# Patient Record
Sex: Male | Born: 1965 | Race: White | Hispanic: No | Marital: Single | Smoking: Current every day smoker
Health system: Southern US, Community
[De-identification: ages and names within clinical notes are randomized; demographics above are authoritative.]

## PROBLEM LIST (undated history)

## (undated) HISTORY — PX: HIP SURGERY: SHX245

## (undated) HISTORY — PX: BLADDER SURGERY: SHX569

---

## 2005-11-19 ENCOUNTER — Emergency Department: Payer: Self-pay | Admitting: Emergency Medicine

## 2006-02-05 ENCOUNTER — Emergency Department: Payer: Self-pay | Admitting: Emergency Medicine

## 2006-05-19 ENCOUNTER — Emergency Department: Payer: Self-pay | Admitting: Emergency Medicine

## 2007-08-20 ENCOUNTER — Emergency Department: Payer: Self-pay | Admitting: Emergency Medicine

## 2011-02-19 ENCOUNTER — Emergency Department: Payer: Self-pay | Admitting: Emergency Medicine

## 2011-06-10 ENCOUNTER — Emergency Department: Payer: Self-pay | Admitting: Emergency Medicine

## 2015-10-10 ENCOUNTER — Emergency Department
Admission: EM | Admit: 2015-10-10 | Discharge: 2015-10-10 | Disposition: A | Discharge status: Home / Self Care | Payer: Self-pay | Attending: Student | Admitting: Student

## 2015-10-10 ENCOUNTER — Encounter: Payer: Self-pay | Admitting: Emergency Medicine

## 2015-10-10 ENCOUNTER — Emergency Department: Payer: Self-pay

## 2015-10-10 DIAGNOSIS — Y9289 Other specified places as the place of occurrence of the external cause: Secondary | ICD-10-CM | POA: Insufficient documentation

## 2015-10-10 DIAGNOSIS — Y93E5 Activity, floor mopping and cleaning: Secondary | ICD-10-CM | POA: Insufficient documentation

## 2015-10-10 DIAGNOSIS — S93491A Sprain of other ligament of right ankle, initial encounter: Secondary | ICD-10-CM | POA: Insufficient documentation

## 2015-10-10 DIAGNOSIS — F17200 Nicotine dependence, unspecified, uncomplicated: Secondary | ICD-10-CM | POA: Insufficient documentation

## 2015-10-10 DIAGNOSIS — W1842XA Slipping, tripping and stumbling without falling due to stepping into hole or opening, initial encounter: Secondary | ICD-10-CM | POA: Insufficient documentation

## 2015-10-10 DIAGNOSIS — Y998 Other external cause status: Secondary | ICD-10-CM | POA: Insufficient documentation

## 2015-10-10 MED ORDER — MELOXICAM 15 MG PO TABS: 15.0000 mg | ORAL_TABLET | Freq: Every day | ORAL | Status: AC

## 2015-10-10 NOTE — ED Notes (Signed)
Pt was helping brother clean up trash and stepped in hole and rolled right ankle. Worst pain across top ankle.

## 2015-10-10 NOTE — Discharge Instructions (Signed)
Ankle Sprain °An ankle sprain is an injury to the strong, fibrous tissues (ligaments) that hold the bones of your ankle joint together.  °CAUSES °An ankle sprain is usually caused by a fall or by twisting your ankle. Ankle sprains most commonly occur when you step on the outer edge of your foot, and your ankle turns inward. People who participate in sports are more prone to these types of injuries.  °SYMPTOMS  °· Pain in your ankle. The pain may be present at rest or only when you are trying to stand or walk. °· Swelling. °· Bruising. Bruising may develop immediately or within 1 to 2 days after your injury. °· Difficulty standing or walking, particularly when turning corners or changing directions. °DIAGNOSIS  °Your caregiver will ask you details about your injury and perform a physical exam of your ankle to determine if you have an ankle sprain. During the physical exam, your caregiver will press on and apply pressure to specific areas of your foot and ankle. Your caregiver will try to move your ankle in certain ways. An X-ray exam may be done to be sure a bone was not broken or a ligament did not separate from one of the bones in your ankle (avulsion fracture).  °TREATMENT  °Certain types of braces can help stabilize your ankle. Your caregiver can make a recommendation for this. Your caregiver may recommend the use of medicine for pain. If your sprain is severe, your caregiver may refer you to a surgeon who helps to restore function to parts of your skeletal system (orthopedist) or a physical therapist. °HOME CARE INSTRUCTIONS  °· Apply ice to your injury for 1-2 days or as directed by your caregiver. Applying ice helps to reduce inflammation and pain. °· Put ice in a plastic bag. °· Place a towel between your skin and the bag. °· Leave the ice on for 15-20 minutes at a time, every 2 hours while you are awake. °· Only take over-the-counter or prescription medicines for pain, discomfort, or fever as directed by  your caregiver. °· Elevate your injured ankle above the level of your heart as much as possible for 2-3 days. °· If your caregiver recommends crutches, use them as instructed. Gradually put weight on the affected ankle. Continue to use crutches or a cane until you can walk without feeling pain in your ankle. °· If you have a plaster splint, wear the splint as directed by your caregiver. Do not rest it on anything harder than a pillow for the first 24 hours. Do not put weight on it. Do not get it wet. You may take it off to take a shower or bath. °· You may have been given an elastic bandage to wear around your ankle to provide support. If the elastic bandage is too tight (you have numbness or tingling in your foot or your foot becomes cold and blue), adjust the bandage to make it comfortable. °· If you have an air splint, you may blow more air into it or let air out to make it more comfortable. You may take your splint off at night and before taking a shower or bath. Wiggle your toes in the splint several times per day to decrease swelling. °SEEK MEDICAL CARE IF:  °· You have rapidly increasing bruising or swelling. °· Your toes feel extremely cold or you lose feeling in your foot. °· Your pain is not relieved with medicine. °SEEK IMMEDIATE MEDICAL CARE IF: °· Your toes are numb or blue. °·   You have severe pain that is increasing. °MAKE SURE YOU:  °· Understand these instructions. °· Will watch your condition. °· Will get help right away if you are not doing well or get worse. °  °This information is not intended to replace advice given to you by your health care provider. Make sure you discuss any questions you have with your health care provider. °  °Document Released: 09/01/2005 Document Revised: 09/22/2014 Document Reviewed: 09/13/2011 °Elsevier Interactive Patient Education ©2016 [employer] ° °Elastic Bandage and RICE °WHAT DOES AN ELASTIC BANDAGE DO? °Elastic bandages come in different shapes and sizes.  They generally provide support to your injury and reduce swelling while you are healing, but they can perform different functions. Your health care provider will help you to decide what is best for your protection, recovery, or rehabilitation following an injury. °WHAT ARE SOME GENERAL TIPS FOR USING AN ELASTIC BANDAGE? °· Use the bandage as directed by the maker of the bandage that you are using. °· Do not wrap the bandage too tightly. This may cut off the circulation in the arm or leg in the area below the bandage. °· If part of your body beyond the bandage becomes blue, numb, cold, swollen, or is more painful, your bandage is most likely too tight. If this occurs, remove your bandage and reapply it more loosely. °· See your health care provider if the bandage seems to be making your problems worse rather than better. °· An elastic bandage should be removed and reapplied every 3-4 hours or as directed by your health care provider. °WHAT IS RICE? °The routine care of many injuries includes rest, ice, compression, and elevation (RICE therapy).  °Rest °Rest is required to allow your body to heal. Generally, you can resume your routine activities when you are comfortable and have been given permission by your health care provider. °Ice °Icing your injury helps to keep the swelling down and it reduces pain. Do not apply ice directly to your skin. °· Put ice in a plastic bag. °· Place a towel between your skin and the bag. °· Leave the ice on for 20 minutes, 2-3 times per day. °Do this for as long as you are directed by your health care provider. °Compression °Compression helps to keep swelling down, gives support, and helps with discomfort. Compression may be done with an elastic bandage. °Elevation °Elevation helps to reduce swelling and it decreases pain. If possible, your injured area should be placed at or above the level of your heart or the center of your chest. °WHEN SHOULD I SEEK MEDICAL CARE? °You should seek  medical care if: °· You have persistent pain and swelling. °· Your symptoms are getting worse rather than improving. °These symptoms may indicate that further evaluation or further X-rays are needed. Sometimes, X-rays may not show a small broken bone (fracture) until a number of days later. Make a follow-up appointment with your health care provider. Ask when your X-ray results will be ready. Make sure that you get your X-ray results. °WHEN SHOULD I SEEK IMMEDIATE MEDICAL CARE? °You should seek immediate medical care if: °· You have a sudden onset of severe pain at or below the area of your injury. °· You develop redness or increased swelling around your injury. °· You have tingling or numbness at or below the area of your injury that does not improve after you remove the elastic bandage. °  °This information is not intended to replace advice given to you by your   health care provider. Make sure you discuss any questions you have with your health care provider. °  °Document Released: 02/21/2002 Document Revised: 05/23/2015 Document Reviewed: 04/17/2014 °Elsevier Interactive Patient Education ©2016 [employer] ° °Stirrup Ankle Brace °Stirrup ankle braces give support and help stabilize the ankle joint. They are rigid pieces of plastic or fiberglass that go up both sides of the lower leg with the bottom of the stirrup fitting comfortably under the bottom of the instep of the foot. It can be held on with Velcro straps or an elastic wrap. Stirrup ankle braces are used to support the ankle following mild or moderate sprains or strains, or fractures after cast removal.  °They can be easily removed or adjusted if there is swelling. The rigid brace shells are designed to fit the ankle comfortably and provide the needed medial/lateral stabilization. This brace can be easily worn with most athletic shoes. The brace liner is usually made of a soft, comfortable gel-like material. This gel fits the ankle well without causing  uncomfortable pressure points.  °IMPORTANCE OF ANKLE BRACES: °· The use of ankle bracing is effective in the prevention of ankle sprains. °· In athletes, the use of ankle bracing will offer protection and prevent further sprains. °· Research shows that a complete rehabilitation program needs to be included with external bracing. This includes range of motion and ankle strengthening exercises. Your caregivers will instruct you in this. °If you were given the brace today for a new injury, use the following home care instructions as a guide. °HOME CARE INSTRUCTIONS  °· Apply ice to the sore area for 15-20 minutes, 03-04 times per day while awake for the first 2 days. Put the ice in a plastic bag and place a towel between the bag of ice and your skin. Never place the ice pack directly on your skin. Be especially careful using ice on an elbow or knee or other bony area, such as your ankle, because icing for too long may damage the nerves which are close to the surface. °· Keep your leg elevated when possible to lessen swelling. °· Wear your splint until you are seen for a follow-up examination. Do not put weight on it. Do not get it wet. You may take it off to take a shower or bath. °· For Activity: Use crutches with non-weight bearing for 1 week. Then, you may walk on your ankle as instructed. Start gradually with weight bearing on the affected ankle. °· Continue to use crutches or a cane until you can stand on your ankle without causing pain. °· Wiggle your toes in the splint several times per day if you are able. °· The splint is too tight if you have numbness, tingling, or if your foot becomes cold and blue. Adjust the straps or elastic bandage to make it comfortable. °· Only take over-the-counter or prescription medicines for pain, discomfort, or fever as directed by your caregiver. °SEEK IMMEDIATE MEDICAL CARE IF:  °· You have increased bruising, swelling or pain. °· Your toes are blue or cold and loosening the  brace or wrap does not help. °· Your pain is not relieved with medicine. °MAKE SURE YOU:  °· Understand these instructions. °· Will watch your condition. °· Will get help right away if you are not doing well or get worse. °  °This information is not intended to replace advice given to you by your health care provider. Make sure you discuss any questions you have with your health care provider. °  °  Document Released: 07/02/2004 Document Revised: 11/24/2011 Document Reviewed: 04/03/2015 °Elsevier Interactive Patient Education ©2016 Elsevier Inc. ° °

## 2015-10-10 NOTE — ED Provider Notes (Signed)
[facility] Emergency Department Provider Note  ____________________________________________  Time seen: Approximately 10:02 PM  I have reviewed the triage vital signs and the nursing notes.   HISTORY  Chief Complaint Ankle Pain    HPI Micheal Wells is a 50 y.o. male who presents emergency department complaining of right ankle pain. Patient states that he was hoping somebody when he stepped into a hole, twisting his ankle. He is endorsing pain to the anterior portion of his ankle. He reports that he is unable to bear weight the ankle. He denies any numbness or tingling into his distal digits. He has not taken any medications prior to arrival   No past medical history on file.  There are no active problems to display for this patient.   Past Surgical History  Procedure Laterality Date  . Hip surgery    . Bladder surgery      Current Outpatient Rx  Name  Route  Sig  Dispense  Refill  . meloxicam (MOBIC) 15 MG tablet   Oral   Take 1 tablet (15 mg total) by mouth daily.   30 tablet   0     Allergies Review of patient's allergies indicates no known allergies.  No family history on file.  Social History Social History  Substance Use Topics  . Smoking status: Current Every Day Smoker  . Smokeless tobacco: Not on file  . Alcohol Use: No     Review of Systems  Constitutional: No fever/chills Eyes: No visual changes. No discharge ENT: No sore throat. Cardiovascular: no chest pain. Respiratory: no cough. No SOB. Gastrointestinal: No abdominal pain.  No nausea, no vomiting.  No diarrhea.  No constipation. Genitourinary: Negative for dysuria. No hematuria Musculoskeletal: Negative for back pain. Positive for right ankle pain Skin: Negative for rash. Neurological: Negative for headaches, focal weakness or numbness. 10-point ROS otherwise negative.  ____________________________________________   PHYSICAL EXAM:  VITAL SIGNS: ED  Triage Vitals  Enc Vitals Group     BP 10/10/15 2110 123/69 mmHg     Pulse Rate 10/10/15 2110 120     Resp 10/10/15 2110 20     Temp 10/10/15 2110 99.3 F (37.4 C)     Temp Source 10/10/15 2110 Oral     SpO2 10/10/15 2110 97 %     Weight 10/10/15 2110 190 lb (86.183 kg)     Height 10/10/15 2110  (1.702 m)     Head Cir --      Peak Flow --      Pain Score 10/10/15 2106 8     Pain Loc --      Pain Edu? --      Excl. in [place]? --      Constitutional: Alert and oriented. Well appearing and in no acute distress. Eyes: Conjunctivae are normal. PERRL. EOMI. Head: Atraumatic. ENT:      Ears:       Nose: No congestion/rhinnorhea.      Mouth/Throat: Mucous membranes are moist.  Neck: No stridor.   Hematological/Lymphatic/Immunilogical: No cervical lymphadenopathy. Cardiovascular: Normal rate, regular rhythm. Normal S1 and S2.  Good peripheral circulation. Respiratory: Normal respiratory effort without tachypnea or retractions. Lungs CTAB. Gastrointestinal: Soft and nontender. No distention. No CVA tenderness. Musculoskeletal: No lower extremity tenderness nor edema.  No joint effusions. Minor edema noted to the anterior aspect of the right ankle compared with left. Limited range of motion due to pain. Patient is tender to palpation of the talonavicular joint line  as well as the medial malleolus. There is no palpable abnormality. Her salicylate his pulses palpated. Sensation is intact 5 digits and is equal to the unaffected extremity. Neurologic:  Normal speech and language. No gross focal neurologic deficits are appreciated.  Skin:  Skin is warm, dry and intact. No rash noted. Psychiatric: Mood and affect are normal. Speech and behavior are normal. Patient exhibits appropriate insight and judgement.   ____________________________________________   LABS (all labs ordered are listed, but only abnormal results are displayed)  Labs Reviewed - No data to  display ____________________________________________  EKG   ____________________________________________  RADIOLOGY Festus Barren Cuthriell, personally viewed and evaluated these images (plain radiographs) as part of my medical decision making, as well as reviewing the written report by the radiologist.  Dg Ankle Complete Right  10/10/2015  CLINICAL DATA:  Rolled right ankle after stepping in hole, with medial and anterior right ankle pain. Initial encounter. EXAM: RIGHT ANKLE - COMPLETE 3+ VIEW COMPARISON:  None. FINDINGS: There is no evidence of fracture or dislocation. The ankle mortise is intact; the interosseous space is within normal limits. No talar tilt or subluxation is seen. A plantar calcaneal spur is noted. The joint spaces are preserved. No significant soft tissue abnormalities are seen. IMPRESSION: No evidence of fracture or dislocation. Electronically Signed   By: Roanna Raider M.D.   On: 10/10/2015 22:27    ____________________________________________    PROCEDURES  Procedure(s) performed:       Medications - No data to display   ____________________________________________   INITIAL IMPRESSION / ASSESSMENT AND PLAN / ED COURSE  Pertinent labs & imaging results that were available during my care of the patient were reviewed by me and considered in my medical decision making (see chart for details).  Patient's diagnosis is consistent with anterior talofibular ligament ankle sprain. Patient will be discharged home with prescriptions for anti-inflammatories. Patient is to follow up with orthopedics if symptoms persist past this treatment course. Patient is given ED precautions to return to the ED for any worsening or new symptoms.     ____________________________________________  FINAL CLINICAL IMPRESSION(S) / ED DIAGNOSES  Final diagnoses:  Sprain of anterior talofibular ligament, right, initial encounter      NEW MEDICATIONS STARTED DURING THIS  VISIT:  New Prescriptions   MELOXICAM (MOBIC) 15 MG TABLET    Take 1 tablet (15 mg total) by mouth daily.        Delorise Royals Cuthriell, PA-C 10/10/15 2956  Gayla Doss, MD 10/11/15 [phone]

## 2017-03-14 IMAGING — CR DG ANKLE COMPLETE 3+V*R*
1 series · 5 of 5 positions shown · non-contrast
Comparison: None.

CLINICAL DATA: Rolled right ankle after stepping in hole, with
medial and anterior right ankle pain. Initial encounter.

EXAM:
RIGHT ANKLE - COMPLETE 3+ VIEW

[Series 1: x ankle ap right · 0.14mm/px · 5 of 5 slices shown]
[im 1/5]
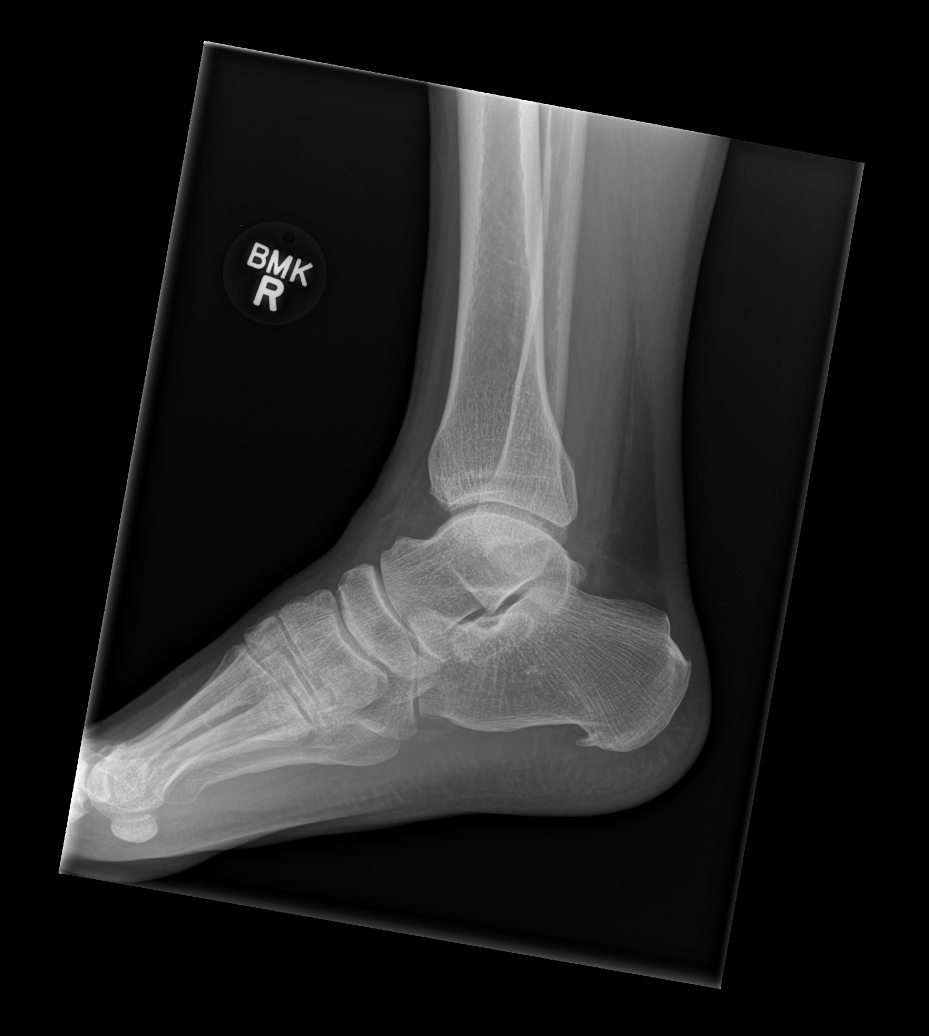
[im 2/5]
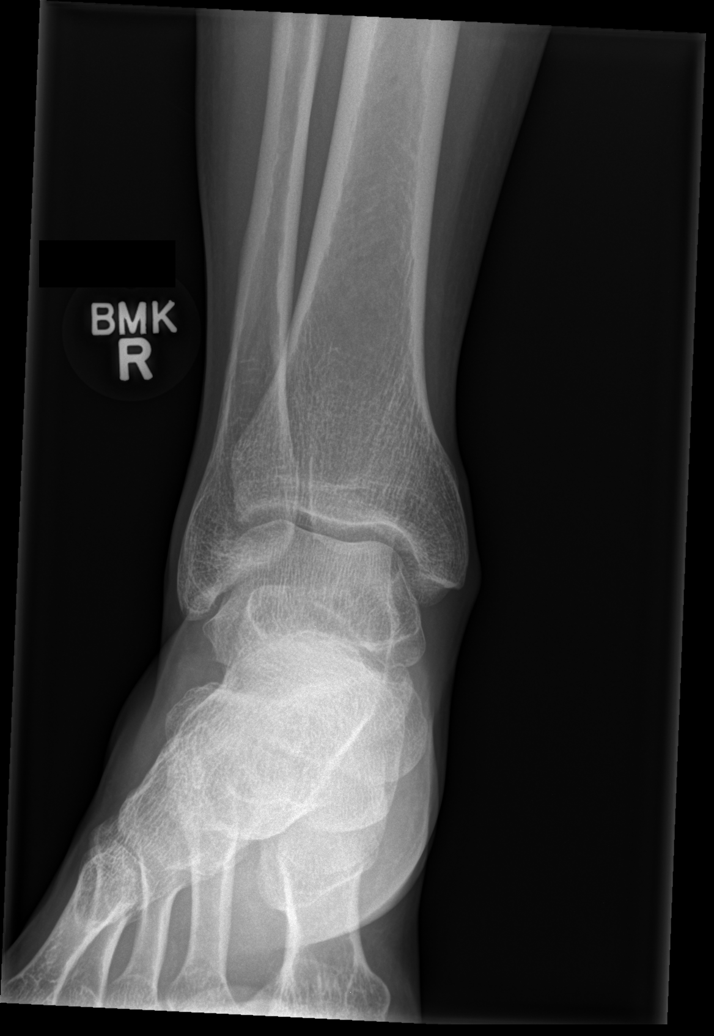
[im 3/5]
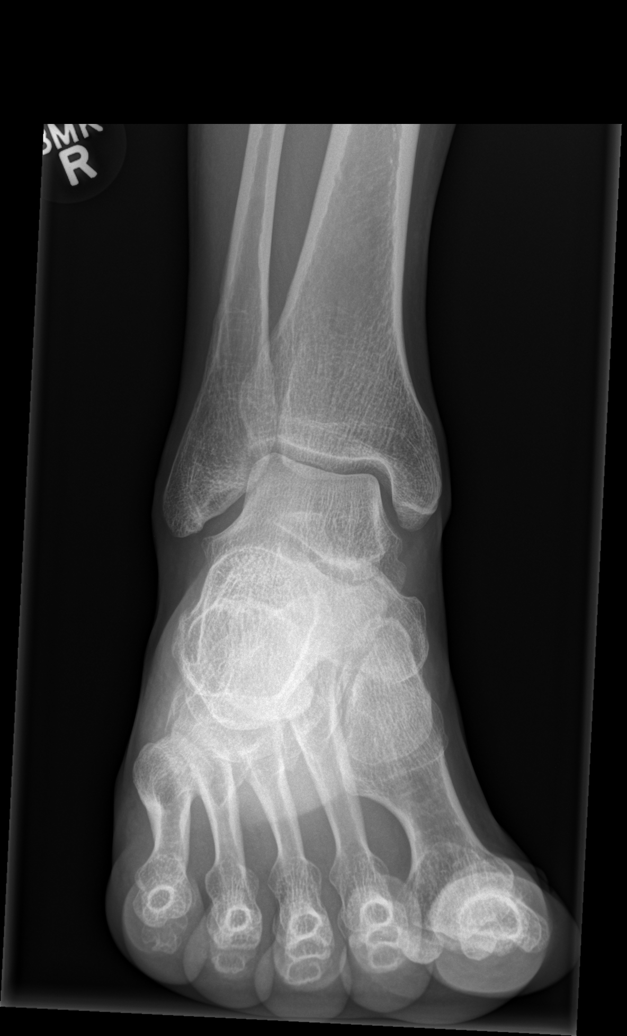
[im 4/5]
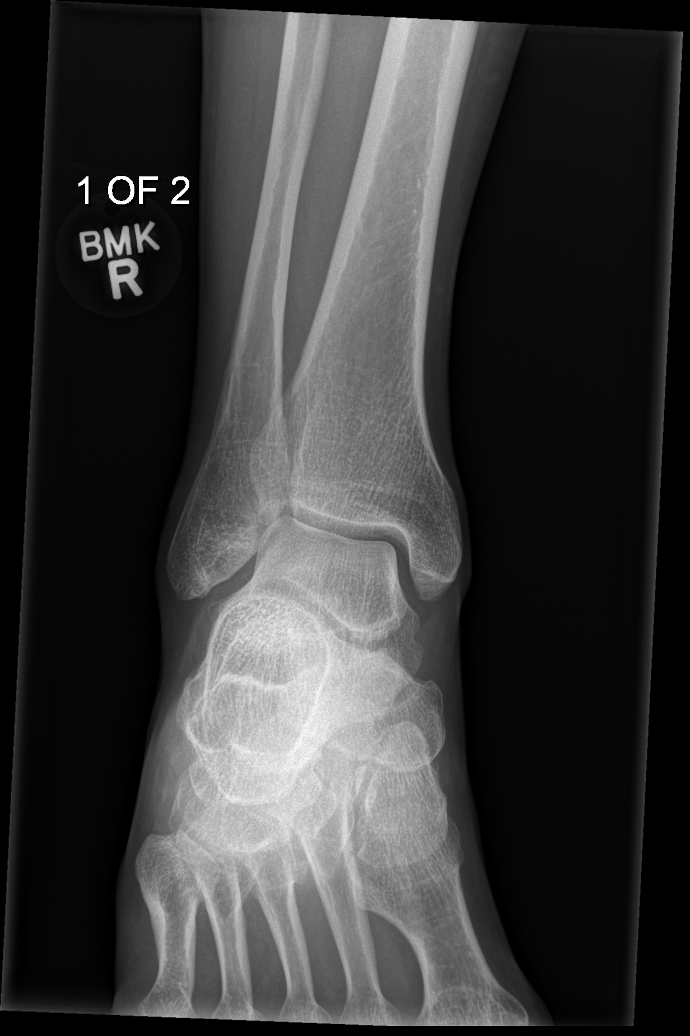
[im 5/5]
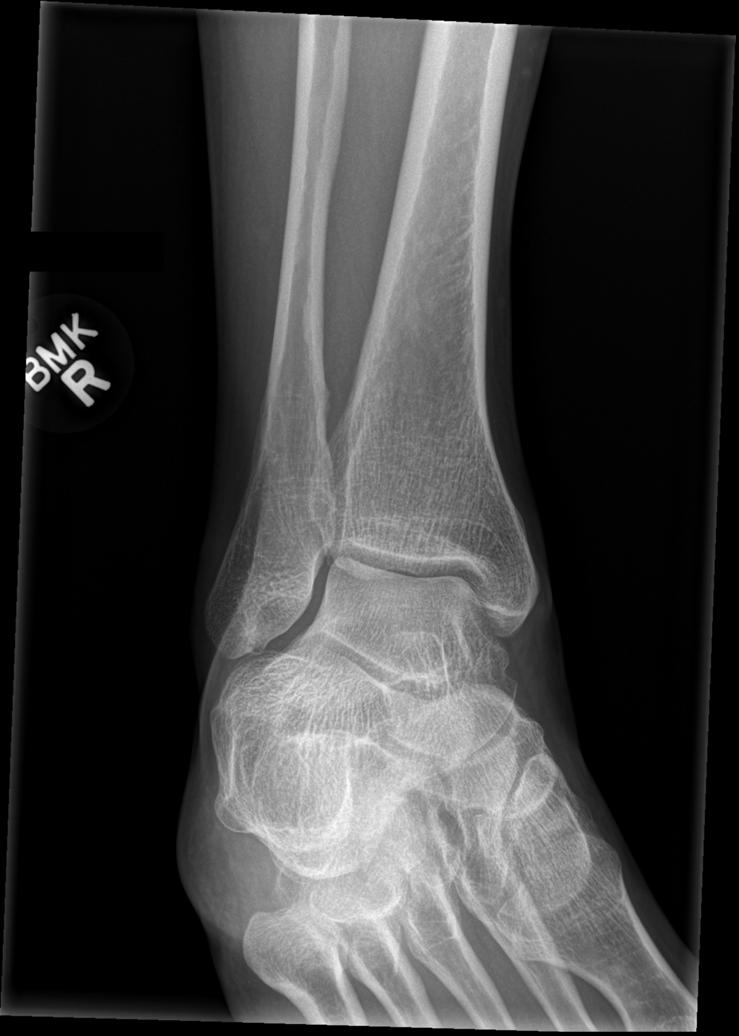

[5 of 5 positions shown; findings below may reference images not displayed]

FINDINGS: There is no evidence of fracture or dislocation. The ankle mortise
is intact; the interosseous space is within normal limits. No talar
tilt or subluxation is seen. A plantar calcaneal spur is noted.

The joint spaces are preserved. No significant soft tissue
abnormalities are seen.
IMPRESSION: No evidence of fracture or dislocation.

## 2019-11-04 ENCOUNTER — Emergency Department
Admission: EM | Admit: 2019-11-04 | Discharge: 2019-11-04 | Disposition: A | Discharge status: Home / Self Care | Payer: Self-pay | Attending: Emergency Medicine | Admitting: Emergency Medicine

## 2019-11-04 ENCOUNTER — Other Ambulatory Visit: Payer: Self-pay

## 2019-11-04 ENCOUNTER — Encounter: Payer: Self-pay | Admitting: Emergency Medicine

## 2019-11-04 DIAGNOSIS — Z79899 Other long term (current) drug therapy: Secondary | ICD-10-CM | POA: Insufficient documentation

## 2019-11-04 DIAGNOSIS — F17210 Nicotine dependence, cigarettes, uncomplicated: Secondary | ICD-10-CM | POA: Insufficient documentation

## 2019-11-04 DIAGNOSIS — K0889 Other specified disorders of teeth and supporting structures: Secondary | ICD-10-CM | POA: Insufficient documentation

## 2019-11-04 MED ORDER — AMOXICILLIN 875 MG PO TABS: 875.0000 mg | ORAL_TABLET | Freq: Two times a day (BID) | ORAL | 0 refills | Status: AC

## 2019-11-04 MED ORDER — AMOXICILLIN 500 MG PO CAPS
500.0000 mg | ORAL_CAPSULE | Freq: Once | ORAL | Status: AC
  Administered 2019-11-04: 500 mg via ORAL
  Filled 2019-11-04: qty 1

## 2019-11-04 NOTE — Discharge Instructions (Signed)
Take Amoxicillin twice daily for the next ten days.  

## 2019-11-04 NOTE — ED Provider Notes (Signed)
.  Emergency Department Provider Note  ____________________________________________  Time seen: Approximately 10:07 PM  I have reviewed the triage vital signs and the nursing notes.   HISTORY  Chief Complaint Oral Swelling   Historian Patient     HPI Micheal Wells is a 54 y.o. male presents to the emergency department with right upper jaw swelling that started today.  Patient states that he plans on making an appointment with a local dentist as soon as swelling improves.  He denies pain underneath the tongue or difficulty swallowing.  No fever at home.  No other alleviating measures have been attempted.   History reviewed. No pertinent past medical history.   Immunizations up to date:  Yes.     History reviewed. No pertinent past medical history.  There are no problems to display for this patient.   Past Surgical History:  Procedure Laterality Date  . BLADDER SURGERY    . HIP SURGERY      Prior to Admission medications   Medication Sig Start Date End Date Taking? Authorizing Provider  amoxicillin (AMOXIL) 875 MG tablet Take 1 tablet (875 mg total) by mouth 2 (two) times daily for 10 days. 11/04/19 11/14/19  Lannie Fields, PA-C  meloxicam (MOBIC) 15 MG tablet Take 1 tablet (15 mg total) by mouth daily. 10/10/15   Cuthriell, Charline Bills, PA-C    Allergies Nsaids  No family history on file.  Social History Social History   Tobacco Use  . Smoking status: Current Every Day Smoker  . Smokeless tobacco: Never Used  Substance Use Topics  . Alcohol use: No  . Drug use: No     Review of Systems  Constitutional: No fever/chills Eyes:  No discharge ENT: Patient has right upper jaw swelling and Superior 3 pain.  Respiratory: no cough. No SOB/ use of accessory muscles to breath Gastrointestinal:   No nausea, no vomiting.  No diarrhea.  No constipation. Musculoskeletal: Negative for musculoskeletal pain. Skin: Negative for rash, abrasions, lacerations,  ecchymosis.   ____________________________________________   PHYSICAL EXAM:  VITAL SIGNS: ED Triage Vitals  Enc Vitals Group     BP 11/04/19 2017 138/84     Pulse Rate 11/04/19 2017 (!) 54     Resp 11/04/19 2017 17     Temp 11/04/19 2017 98.6 F (37 C)     Temp Source 11/04/19 2017 Oral     SpO2 11/04/19 2017 99 %     Weight 11/04/19 2015 188 lb (85.3 kg)     Height 11/04/19 2015 5\' 7"  (1.702 m)     Head Circumference --      Peak Flow --      Pain Score 11/04/19 2015 8     Pain Loc --      Pain Edu? --      Excl. in [place]? --      Constitutional: Alert and oriented. Well appearing and in no acute distress. Eyes: Conjunctivae are normal. PERRL. EOMI. Head: Atraumatic. ENT:      Ears: TMs are pearly bilaterally.      Nose: No congestion/rhinnorhea.      Mouth/Throat: Mucous membranes are moist. Patient has Superior 3 pain.  No pain underneath the tongue. Neck: No stridor.  No cervical spine tenderness to palpation. Cardiovascular: Normal rate, regular rhythm. Normal S1 and S2.  Good peripheral circulation. Respiratory: Normal respiratory effort without tachypnea or retractions. Lungs CTAB. Good air entry to the bases with no decreased or absent breath sounds  Skin:  Skin is warm, dry and intact. No rash noted. Psychiatric: Mood and affect are normal for age. Speech and behavior are normal.   ____________________________________________   LABS (all labs ordered are listed, but only abnormal results are displayed)  Labs Reviewed - No data to display ____________________________________________  EKG   ____________________________________________  RADIOLOGY  No results found.  ____________________________________________    PROCEDURES  Procedure(s) performed:     Procedures     Medications  amoxicillin (AMOXIL) capsule 500 mg (has no administration in time range)     ____________________________________________   INITIAL IMPRESSION /  ASSESSMENT AND PLAN / ED COURSE  Pertinent labs & imaging results that were available during my care of the patient were reviewed by me and considered in my medical decision making (see chart for details).      Assessment and plan Dental pain 54 year old male presents to the emergency department with right upper jaw swelling and pain localized to superior 3.  History and physical exam findings suggest dental abscess.  Patient reports that he has tolerated amoxicillin well in the past.  Patient was advised to take amoxicillin twice a day for the next 10 days.  Dental resources were provided in discharge paperwork.  Return precautions were given to return with new or worsening symptoms.  All patient questions were answered.  ____________________________________________  FINAL CLINICAL IMPRESSION(S) / ED DIAGNOSES  Final diagnoses:  Pain, dental      NEW MEDICATIONS STARTED DURING THIS VISIT:  ED Discharge Orders         Ordered    amoxicillin (AMOXIL) 875 MG tablet  2 times daily     11/04/19 2202              This chart was dictated using voice recognition software/Dragon. Despite best efforts to proofread, errors can occur which can change the meaning. Any change was purely unintentional.     Gasper Lloyd 11/04/19 2213    Phineas Semen, MD 11/04/19 2230

## 2019-11-04 NOTE — ED Triage Notes (Signed)
Pt arrives ambulatory to triage with c/o right upper sided dental pain which started today. Pt states that "i've had em all taken out but six". Pt is in NAD.

## 2023-10-09 ENCOUNTER — Emergency Department
Admission: EM | Admit: 2023-10-09 | Discharge: 2023-10-09 | Disposition: A | Discharge status: Home / Self Care | Payer: Self-pay | Attending: Emergency Medicine | Admitting: Emergency Medicine

## 2023-10-09 ENCOUNTER — Encounter: Payer: Self-pay | Admitting: Emergency Medicine

## 2023-10-09 ENCOUNTER — Other Ambulatory Visit: Payer: Self-pay

## 2023-10-09 ENCOUNTER — Emergency Department: Payer: Self-pay

## 2023-10-09 DIAGNOSIS — E876 Hypokalemia: Secondary | ICD-10-CM | POA: Insufficient documentation

## 2023-10-09 DIAGNOSIS — D72829 Elevated white blood cell count, unspecified: Secondary | ICD-10-CM | POA: Insufficient documentation

## 2023-10-09 DIAGNOSIS — K269 Duodenal ulcer, unspecified as acute or chronic, without hemorrhage or perforation: Secondary | ICD-10-CM | POA: Insufficient documentation

## 2023-10-09 LAB — CBC
HCT: 45.1 % (ref 39.0–52.0)
Hemoglobin: 15.8 g/dL (ref 13.0–17.0)
MCH: 28.7 pg (ref 26.0–34.0)
MCHC: 35 g/dL (ref 30.0–36.0)
MCV: 82 fL (ref 80.0–100.0)
Platelets: 538 10*3/uL — ABNORMAL HIGH (ref 150–400)
RBC: 5.5 MIL/uL (ref 4.22–5.81)
RDW: 13.9 % (ref 11.5–15.5)
WBC: 17.5 10*3/uL — ABNORMAL HIGH (ref 4.0–10.5)
nRBC: 0 % (ref 0.0–0.2)

## 2023-10-09 LAB — COMPREHENSIVE METABOLIC PANEL
ALT: 12 U/L (ref 0–44)
AST: 13 U/L — ABNORMAL LOW (ref 15–41)
Albumin: 3.7 g/dL (ref 3.5–5.0)
Alkaline Phosphatase: 89 U/L (ref 38–126)
Anion gap: 14 (ref 5–15)
BUN: 12 mg/dL (ref 6–20)
CO2: 26 mmol/L (ref 22–32)
Calcium: 9 mg/dL (ref 8.9–10.3)
Chloride: 92 mmol/L — ABNORMAL LOW (ref 98–111)
Creatinine, Ser: 1.21 mg/dL (ref 0.61–1.24)
GFR, Estimated: >60 mL/min (ref >60)
Glucose, Bld: 139 mg/dL — ABNORMAL HIGH (ref 70–99)
Potassium: 2.8 mmol/L — ABNORMAL LOW (ref 3.5–5.1)
Sodium: 132 mmol/L — ABNORMAL LOW (ref 135–145)
Total Bilirubin: 0.9 mg/dL (ref 0.0–1.2)
Total Protein: 6.9 g/dL (ref 6.5–8.1)

## 2023-10-09 LAB — URINALYSIS, ROUTINE W REFLEX MICROSCOPIC
Bilirubin Urine: NEGATIVE
Glucose, UA: NEGATIVE mg/dL
Hgb urine dipstick: NEGATIVE
Ketones, ur: NEGATIVE mg/dL
Leukocytes,Ua: NEGATIVE
Nitrite: NEGATIVE
Protein, ur: NEGATIVE mg/dL
Specific Gravity, Urine: 1.012 (ref 1.005–1.030)
pH: 5 (ref 5.0–8.0)

## 2023-10-09 LAB — LIPASE, BLOOD: Lipase: 22 U/L (ref 11–51)

## 2023-10-09 LAB — LACTIC ACID, PLASMA: Lactic Acid, Venous: 1.8 mmol/L (ref 0.5–1.9)

## 2023-10-09 MED ORDER — FAMOTIDINE IN NACL 20-0.9 MG/50ML-% IV SOLN
20.0000 mg | Freq: Once | INTRAVENOUS | Status: AC
  Administered 2023-10-09: 20 mg via INTRAVENOUS
  Filled 2023-10-09: qty 50

## 2023-10-09 MED ORDER — ONDANSETRON HCL 4 MG/2ML IJ SOLN
4.0000 mg | Freq: Once | INTRAMUSCULAR | Status: AC
  Administered 2023-10-09: 4 mg via INTRAVENOUS
  Filled 2023-10-09: qty 2

## 2023-10-09 MED ORDER — IOHEXOL 300 MG/ML  SOLN
100.0000 mL | Freq: Once | INTRAMUSCULAR | Status: AC | PRN
  Administered 2023-10-09: 100 mL via INTRAVENOUS

## 2023-10-09 MED ORDER — POTASSIUM CHLORIDE 10 MEQ/100ML IV SOLN
10.0000 meq | INTRAVENOUS | Status: DC
  Administered 2023-10-09: 10 meq via INTRAVENOUS
  Filled 2023-10-09: qty 100

## 2023-10-09 MED ORDER — POTASSIUM CHLORIDE CRYS ER 20 MEQ PO TBCR: 20.0000 meq | EXTENDED_RELEASE_TABLET | Freq: Two times a day (BID) | ORAL | 0 refills | Status: AC

## 2023-10-09 MED ORDER — ONDANSETRON 4 MG PO TBDP: 4.0000 mg | ORAL_TABLET | Freq: Three times a day (TID) | ORAL | 0 refills | Status: AC | PRN

## 2023-10-09 MED ORDER — POTASSIUM CHLORIDE CRYS ER 20 MEQ PO TBCR
40.0000 meq | EXTENDED_RELEASE_TABLET | Freq: Once | ORAL | Status: AC
  Administered 2023-10-09: 40 meq via ORAL
  Filled 2023-10-09: qty 2

## 2023-10-09 MED ORDER — PANTOPRAZOLE SODIUM 40 MG PO TBEC: 40.0000 mg | DELAYED_RELEASE_TABLET | Freq: Two times a day (BID) | ORAL | 1 refills | Status: AC

## 2023-10-09 MED ORDER — SODIUM CHLORIDE 0.9 % IV BOLUS
500.0000 mL | Freq: Once | INTRAVENOUS | Status: AC
  Administered 2023-10-09: 500 mL via INTRAVENOUS

## 2023-10-09 NOTE — ED Triage Notes (Signed)
Pt comes with belly pain and vomiting that started week ago. Pt state right lower side. Pt stats burning feeling.

## 2023-10-09 NOTE — ED Notes (Signed)
See triage note Presents with some lower abd pain   States pain became worse  last pm    No fever  but has had n/v for about 1 week

## 2023-10-09 NOTE — Discharge Instructions (Signed)
Follow-up with Dr.Anna's office, call on Monday for an appointment Take the protonix as prescribed Take the potassium as prescribed Zofran odt is for nausea and vomiting as needed

## 2023-10-09 NOTE — ED Provider Notes (Signed)
[facility] Provider Note    Event Date/Time   First MD Initiated Contact with Patient 10/09/23 1306     (approximate)   History   Abdominal Pain   HPI  Micheal Wells is a 58 y.o. male with no significant past medical history presents emergency department complaining of right lower quadrant abdominal pain, vomiting that started a week ago.  No diarrhea.  Patient states he has not been feeling well for at least a week.  Has never had abdominal surgery before.      Physical Exam   Triage Vital Signs: ED Triage Vitals  Encounter Vitals Group     BP 10/09/23 1109 (!) 150/94     Systolic BP Percentile --      Diastolic BP Percentile --      Pulse Rate 10/09/23 1109 (!) 114     Resp 10/09/23 1109 18     Temp 10/09/23 1109 97.8 F (36.6 C)     Temp Source 10/09/23 1109 Oral     SpO2 10/09/23 1109 96 %     Weight 10/09/23 1109 185 lb (83.9 kg)     Height 10/09/23 1109 5\' 7"  (1.702 m)     Head Circumference --      Peak Flow --      Pain Score 10/09/23 1110 8     Pain Loc --      Pain Education --      Exclude from Growth Chart --     Most recent vital signs: Vitals:   10/09/23 1109 10/09/23 1524  BP: (!) 150/94 (!) 140/80  Pulse: (!) 114 99  Resp: 18 18  Temp: 97.8 F (36.6 C) 98 F (36.7 C)  SpO2: 96% 96%     General: Awake, no distress.   CV:  Good peripheral perfusion. regular rate and  rhythm Resp:  Normal effort. Lungs CTA Abd:  No distention.  Tender in the right lower quadrant, bowel sounds normal all 4 quads Other:      ED Results / Procedures / Treatments   Labs (all labs ordered are listed, but only abnormal results are displayed) Labs Reviewed  COMPREHENSIVE METABOLIC PANEL - Abnormal; Notable for the following components:      Result Value   Sodium 132 (*)    Potassium 2.8 (*)    Chloride 92 (*)    Glucose, Bld 139 (*)    AST 13 (*)    All other components within normal limits  CBC - Abnormal; Notable for  the following components:   WBC 17.5 (*)    Platelets 538 (*)    All other components within normal limits  URINALYSIS, ROUTINE W REFLEX MICROSCOPIC - Abnormal; Notable for the following components:   Color, Urine YELLOW (*)    APPearance CLEAR (*)    All other components within normal limits  LIPASE, BLOOD  LACTIC ACID, PLASMA     EKG     RADIOLOGY CT abdomen pelvis IV contrast    PROCEDURES:   Procedures Chief Complaint  Patient presents with   Abdominal Pain      MEDICATIONS ORDERED IN ED: Medications  potassium chloride SA (KLOR-CON M) CR tablet 40 mEq (40 mEq Oral Given 10/09/23 1334)  sodium chloride 0.9 % bolus 500 mL (0 mLs Intravenous Stopped 10/09/23 1555)  ondansetron (ZOFRAN) injection 4 mg (4 mg Intravenous Given 10/09/23 1332)  iohexol (OMNIPAQUE) 300 MG/ML solution 100 mL (100 mLs Intravenous Contrast Given 10/09/23 1343)  famotidine (PEPCID) IVPB 20 mg premix (0 mg Intravenous Stopped 10/09/23 1555)     IMPRESSION / MDM / ASSESSMENT AND PLAN / ED COURSE  I reviewed the triage vital signs and the nursing notes.                              Differential diagnosis includes, but is not limited to, acute viral gastroenteritis, appendicitis, diverticulitis, bowel obstruction, UTI, dehydration, sepsis  Patient's presentation is most consistent with acute illness / injury with system symptoms.   WBC elevated 17.5 indicating infection, patient also has hypokalemia with potassium of 2.8 and sodium is a little decreased at 132 which can be corrected with IV fluids.  Patient will be given K-Lor 40 mEq here in the ED.  Also will do CT abdomen pelvis, due to the elevated white count patient is tachycardic I did start undifferentiated sepsis protocols.  Will await lactic acid and CT abdomen pelvis to determine if patient is septic  Lactic acid is reassuring  CT abdomen pelvis IV contrast independently reviewed and interpreted by me after reading radiologist  report and images as being positive for duodenal bulb ulcer.  Appendix appears to be normal.  Due to the elevated WBC along with duodenal ulcer and hypokalemia I did discuss admission with the patient.  Dr. Alfred Levins discussed admission with patient.  He states he would like to go home and would not like to stay in the hospital.  He was given medication to correct his hypokalemia and medication for ulcer.  We can treat him outpatient with medications for hypokalemia, nausea, and peptic ulcer.  Did discuss with Dr. Tobi Bastos, states patient is to call Monday for an appointment so he can have close follow-up.  I did discuss all of this with the patient and his mother.  Patient is in agreement with treatment plan and will call Dr. Jori Moll office on Monday.  Patient was given a prescription for Protonix 40 mg twice daily, Zofran 4 mg ODT if nausea and vomiting, and K-Dur 20 mEq twice daily for 5 days.  He is to return to the emergency department if worsening.  Patient states he understands.  He was discharged in stable condition.      FINAL CLINICAL IMPRESSION(S) / ED DIAGNOSES   Final diagnoses:  Duodenal bulb ulcer  Hypokalemia     Rx / DC Orders   ED Discharge Orders          Ordered    pantoprazole (PROTONIX) 40 MG tablet  2 times daily        10/09/23 1542    potassium chloride SA (KLOR-CON M) 20 MEQ tablet  2 times daily        10/09/23 1542    ondansetron (ZOFRAN-ODT) 4 MG disintegrating tablet  Every 8 hours PRN        10/09/23 1542             Note:  This document was prepared using Dragon voice recognition software and may include unintentional dictation errors.    Faythe Ghee, PA-C 10/09/23 1600    Concha Se, MD 10/11/23 [phone]
